# Patient Record
Sex: Female | Born: 1965 | Race: White | Hispanic: No | Smoking: Never smoker
Health system: Southern US, Community
[De-identification: ages and names within clinical notes are randomized; demographics above are authoritative.]

## PROBLEM LIST (undated history)

## (undated) DIAGNOSIS — I10 Essential (primary) hypertension: Secondary | ICD-10-CM

---

## 2017-08-31 ENCOUNTER — Emergency Department
Admission: EM | Admit: 2017-08-31 | Discharge: 2017-08-31 | Disposition: A | Discharge status: Home / Self Care | Payer: Worker's Compensation | Attending: Emergency Medicine | Admitting: Emergency Medicine

## 2017-08-31 ENCOUNTER — Emergency Department: Payer: Worker's Compensation

## 2017-08-31 ENCOUNTER — Encounter: Payer: Self-pay | Admitting: Emergency Medicine

## 2017-08-31 DIAGNOSIS — S9031XA Contusion of right foot, initial encounter: Secondary | ICD-10-CM

## 2017-08-31 DIAGNOSIS — Y990 Civilian activity done for income or pay: Secondary | ICD-10-CM | POA: Insufficient documentation

## 2017-08-31 DIAGNOSIS — Y939 Activity, unspecified: Secondary | ICD-10-CM | POA: Insufficient documentation

## 2017-08-31 DIAGNOSIS — W208XXA Other cause of strike by thrown, projected or falling object, initial encounter: Secondary | ICD-10-CM | POA: Insufficient documentation

## 2017-08-31 DIAGNOSIS — I10 Essential (primary) hypertension: Secondary | ICD-10-CM | POA: Diagnosis not present

## 2017-08-31 DIAGNOSIS — S99921A Unspecified injury of right foot, initial encounter: Secondary | ICD-10-CM | POA: Diagnosis present

## 2017-08-31 DIAGNOSIS — Y929 Unspecified place or not applicable: Secondary | ICD-10-CM | POA: Diagnosis not present

## 2017-08-31 HISTORY — DX: Essential (primary) hypertension: I10

## 2017-08-31 MED ORDER — IBUPROFEN 400 MG PO TABS
600.0000 mg | ORAL_TABLET | Freq: Once | ORAL | Status: AC
  Administered 2017-08-31: 600 mg via ORAL

## 2017-08-31 MED ORDER — IBUPROFEN 800 MG PO TABS
ORAL_TABLET | ORAL | Status: AC
  Administered 2017-08-31: 600 mg via ORAL
  Filled 2017-08-31: qty 1

## 2017-08-31 MED ORDER — OXYCODONE-ACETAMINOPHEN 5-325 MG PO TABS
1.0000 | ORAL_TABLET | Freq: Once | ORAL | Status: AC
Start: 2017-08-31 — End: 2017-08-31
  Administered 2017-08-31: 1 via ORAL
  Filled 2017-08-31: qty 1

## 2017-08-31 NOTE — ED Notes (Signed)
ED Provider at bedside. 

## 2017-08-31 NOTE — ED Triage Notes (Signed)
Pt states she dropped a pallet at work on her right foot. Pt with bruising noted to to top of right foot at base of great toe. Pt states her toes feel numb, however pt jumps when great toe is palpated. Pt is able to move all toes and cap refill to toes is 3 seconds. Ice applied in triage.

## 2017-08-31 NOTE — ED Provider Notes (Signed)
[facility] Emergency Department Provider Note  ____________________________________________   First MD Initiated Contact with Patient 08/31/17 2154     (approximate)  I have reviewed the triage vital signs and the nursing notes.   HISTORY  Chief Complaint Foot Injury   HPI Kelly Daniel is a 51 y.o. female with history of hypertension who is presenting with right foot pain. She says that at her job a 1000 pound pallet was dropped onto her right forefoot at a height of about 4-5 inches. She says that the palate was on top of her foot for about 1 minute before she is able to remove it with a pallet jack. She says that she is now having moderate to severe pain as well as swelling to the right and medial forefoot. She is able to range her toes but says there is a numbness to her foot at the site of the injury. Says that the swelling at this time as gone down. She has been able to ambulate on her heel since the injury occurred.   Past Medical History:  Diagnosis Date  . Hypertension     There are no active problems to display for this patient.   History reviewed. No pertinent surgical history.  Prior to Admission medications   Not on File    Allergies Iodine  History reviewed. No pertinent family history.  Social History Social History  Substance Use Topics  . Smoking status: Never Smoker  . Smokeless tobacco: Never Used  . Alcohol use No    Review of Systems  Constitutional: No fever/chills Eyes: No visual changes. ENT: No sore throat. Cardiovascular: Denies chest pain. Respiratory: Denies shortness of breath. Gastrointestinal: No abdominal pain.  No nausea, no vomiting.  No diarrhea.  No constipation. Genitourinary: Negative for dysuria. Musculoskeletal: Negative for back pain. Skin: Negative for rash. Neurological: Negative for headaches, focal weakness or numbness.   ____________________________________________   PHYSICAL  EXAM:  VITAL SIGNS: ED Triage Vitals  Enc Vitals Group     BP 08/31/17 2039 140/69     Pulse Rate 08/31/17 2039 100     Resp 08/31/17 2039 16     Temp 08/31/17 2039 97.7 F (36.5 C)     Temp Source 08/31/17 2039 Oral     SpO2 08/31/17 2039 100 %     Weight 08/31/17 2039 155 lb (70.3 kg)     Height 08/31/17 2039  (1.676 m)     Head Circumference --      Peak Flow --      Pain Score 08/31/17 2038 7     Pain Loc --      Pain Edu? --      Excl. in GC? --     Constitutional: Alert and oriented. Well appearing and in no acute distress. Eyes: Conjunctivae are normal.  Head: Atraumatic. Nose: No congestion/rhinnorhea. Mouth/Throat: Mucous membranes are moist.  Neck: No stridor.   Cardiovascular: Normal rate, regular rhythm. Grossly normal heart sounds.  Good peripheral circulation. Respiratory: Normal respiratory effort.  No retractions. Lungs CTAB. Gastrointestinal: Soft and nontender. No distention.  Musculoskeletal:  right medial foot with ecchymosis as well as mild to moderate swelling compared to the left over the first and second tarsals. There is no joint swelling. The patient is able to range all of her toes equally to both flexion and extension. Brisk capillary refill to the nailbeds. Sensation is fully decreased to light touch. Dorsalis pedis pulse is present. No tenderness to  the ankle about the malleoli. No deformity.  Neurologic:  Normal speech and language. No gross focal neurologic deficits are appreciated. Skin:  Skin is warm, dry and intact. No rash noted. Psychiatric: Mood and affect are normal. Speech and behavior are normal.  ____________________________________________   LABS (all labs ordered are listed, but only abnormal results are displayed)  Labs Reviewed - No data to display ____________________________________________  EKG   ____________________________________________  RADIOLOGY  negative for fracture of the right  foot. ____________________________________________   PROCEDURES  Procedure(s) performed:   Procedures  Critical Care performed:   ____________________________________________   INITIAL IMPRESSION / ASSESSMENT AND PLAN / ED COURSE  Pertinent labs & imaging results that were available during my care of the patient were reviewed by me and considered in my medical decision making (see chart for details).  DDX: Contusion, foot fracture, tendon rupture, muscle injury  Patient with a negative x-ray for fracture. Likely contusion. Patient will be placed in an Ace wrap. We discussed rice therapy. Patient also be given crutches. 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 Patient is understanding of the plan and willing to comply.      ____________________________________________   FINAL CLINICAL IMPRESSION(S) / ED DIAGNOSES  foot contusion    NEW MEDICATIONS STARTED DURING THIS VISIT:  New Prescriptions   No medications on file     Note:  This document was prepared using Dragon voice recognition software and may include unintentional dictation errors.     Myrna Blazer, MD 08/31/17 2221

## 2018-10-25 IMAGING — CR DG FOOT COMPLETE 3+V*R*
3 series · 3 of 3 positions shown · non-contrast
Comparison: None.

CLINICAL DATA: Patient states she dropped a pallet at work on her
right foot. Patient with bruising noted to to top of right foot at
base of great toe.

EXAM:
RIGHT FOOT COMPLETE - 3+ VIEW

[foot ap]
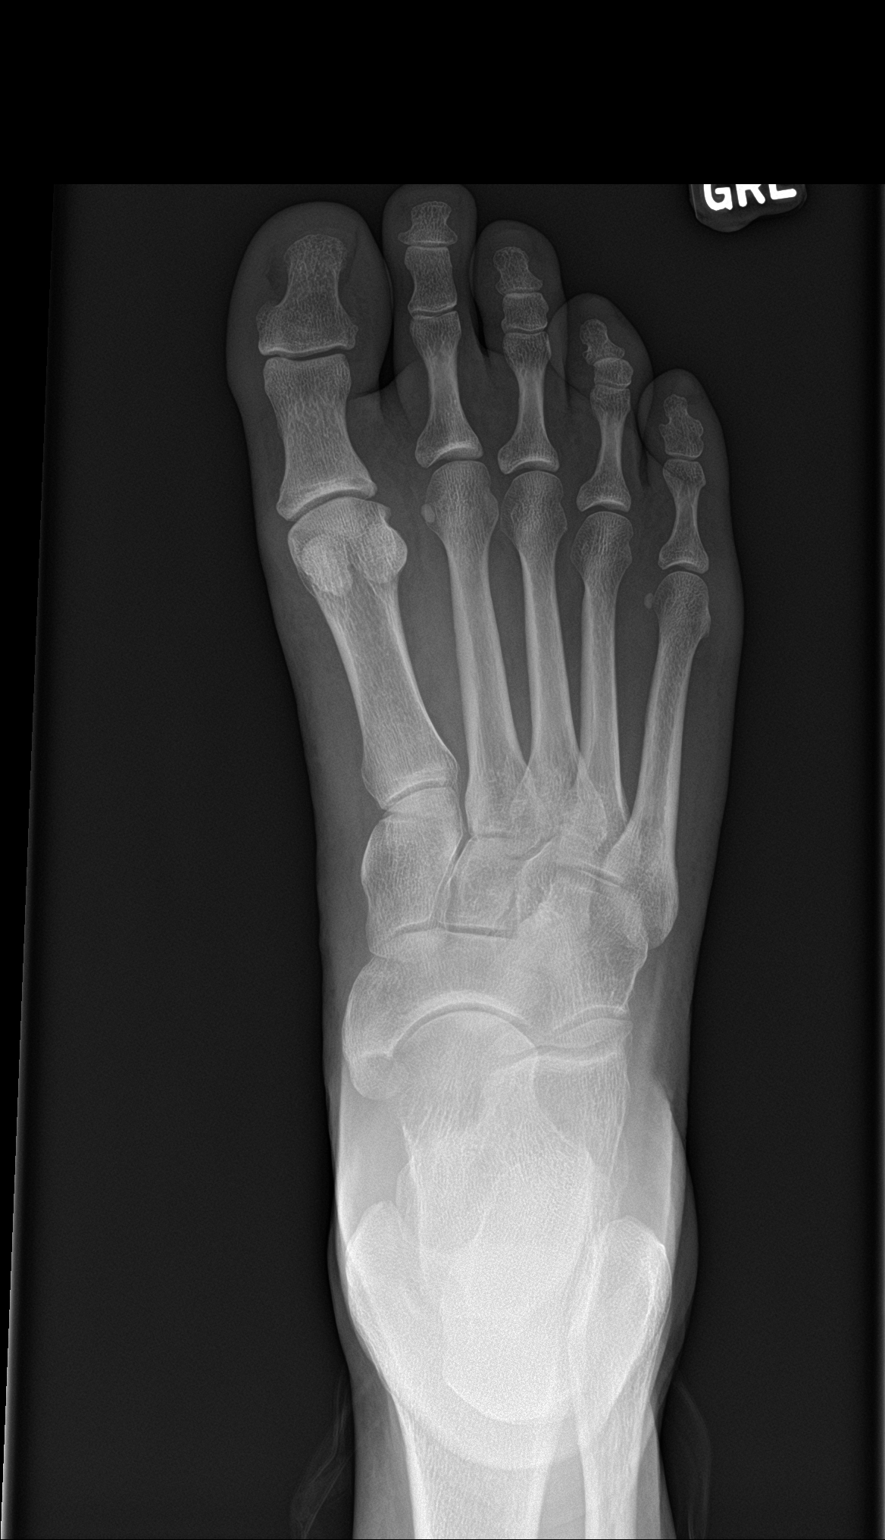

[foot obl]
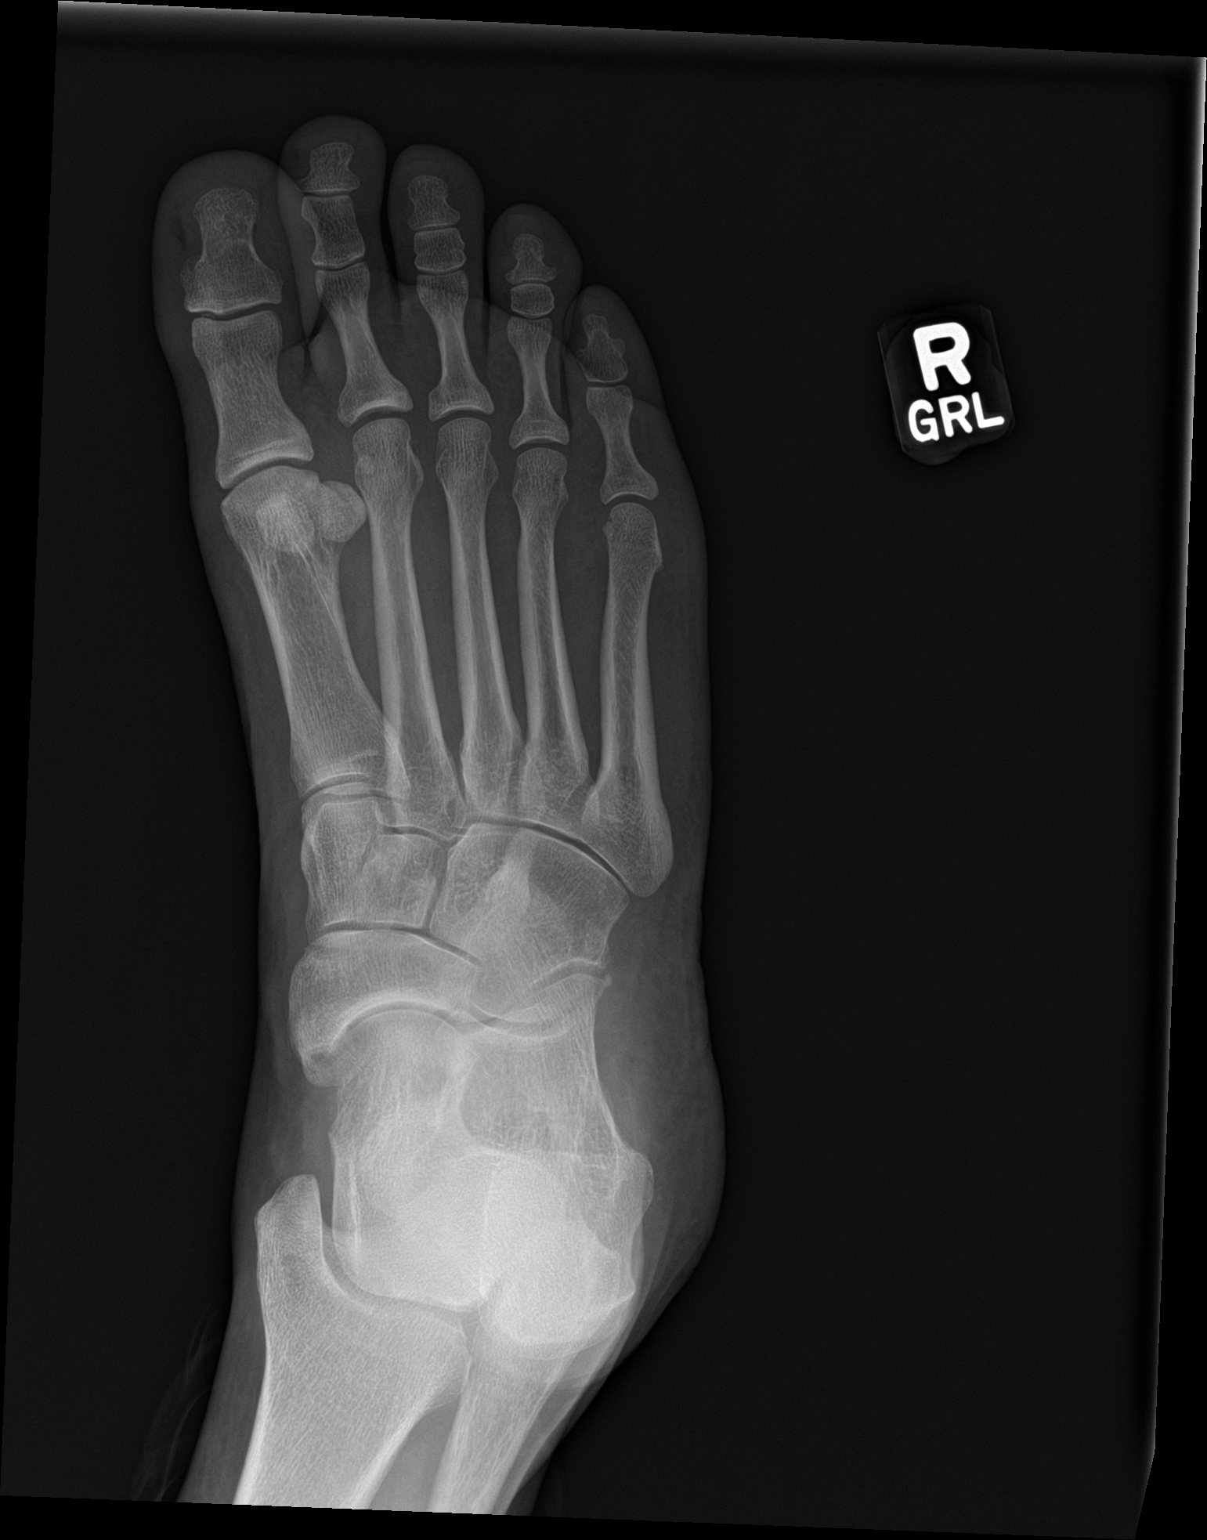

[foot lat]
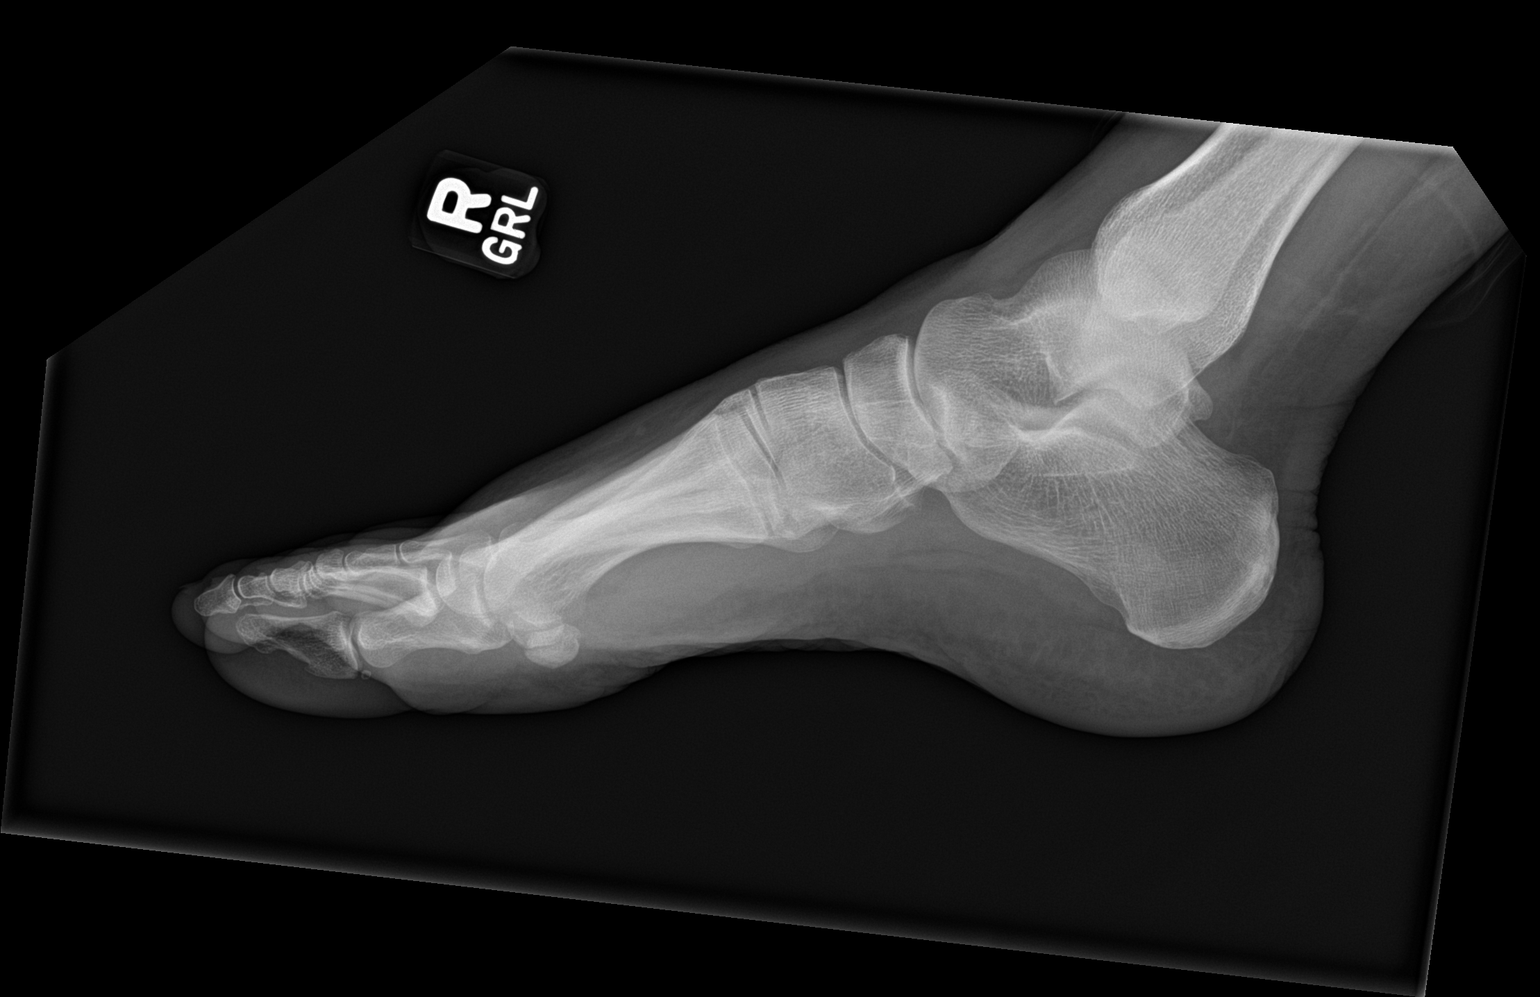

[3 of 3 positions shown; findings below may reference images not displayed]

FINDINGS: There is no evidence of fracture or dislocation. There is no
evidence of arthropathy or other focal bone abnormality. Soft
tissues are unremarkable.
IMPRESSION: Negative.
